# Patient Record
Sex: Male | Born: 1975 | Race: White | Hispanic: No | Marital: Single | Smoking: Never smoker
Health system: Southern US, Community
[De-identification: ages and names within clinical notes are randomized; demographics above are authoritative.]

## PROBLEM LIST (undated history)

## (undated) DIAGNOSIS — I10 Essential (primary) hypertension: Secondary | ICD-10-CM

## (undated) HISTORY — PX: BACK SURGERY: SHX140

---

## 2004-08-01 ENCOUNTER — Emergency Department: Payer: Self-pay | Admitting: Emergency Medicine

## 2004-08-19 ENCOUNTER — Emergency Department: Payer: Self-pay | Admitting: Emergency Medicine

## 2009-05-03 ENCOUNTER — Emergency Department: Payer: Self-pay | Admitting: Unknown Physician Specialty

## 2014-01-17 ENCOUNTER — Emergency Department: Payer: Self-pay | Admitting: Emergency Medicine

## 2015-10-05 ENCOUNTER — Ambulatory Visit: Payer: Self-pay

## 2015-10-05 DIAGNOSIS — J4540 Moderate persistent asthma, uncomplicated: Secondary | ICD-10-CM

## 2015-10-05 NOTE — Progress Notes (Signed)
This encounter was created in error - please disregard.

## 2016-12-02 ENCOUNTER — Emergency Department
Admission: EM | Admit: 2016-12-02 | Discharge: 2016-12-02 | Disposition: A | Discharge status: Home / Self Care | Payer: Self-pay | Attending: Emergency Medicine | Admitting: Emergency Medicine

## 2016-12-02 ENCOUNTER — Emergency Department: Payer: Self-pay

## 2016-12-02 ENCOUNTER — Encounter: Payer: Self-pay | Admitting: Emergency Medicine

## 2016-12-02 DIAGNOSIS — I10 Essential (primary) hypertension: Secondary | ICD-10-CM | POA: Insufficient documentation

## 2016-12-02 DIAGNOSIS — L03211 Cellulitis of face: Secondary | ICD-10-CM

## 2016-12-02 DIAGNOSIS — K122 Cellulitis and abscess of mouth: Secondary | ICD-10-CM | POA: Insufficient documentation

## 2016-12-02 HISTORY — DX: Essential (primary) hypertension: I10

## 2016-12-02 LAB — CBC WITH DIFFERENTIAL/PLATELET
BASOS ABS: 0.1 10*3/uL (ref 0–0.1)
Basophils Relative: 1 %
EOS PCT: 2 %
Eosinophils Absolute: 0.2 10*3/uL (ref 0–0.7)
HEMATOCRIT: 48.2 % (ref 40.0–52.0)
Hemoglobin: 16.5 g/dL (ref 13.0–18.0)
LYMPHS ABS: 0.9 10*3/uL — AB (ref 1.0–3.6)
LYMPHS PCT: 9 %
MCH: 33.6 pg (ref 26.0–34.0)
MCHC: 34.3 g/dL (ref 32.0–36.0)
MCV: 97.9 fL (ref 80.0–100.0)
MONO ABS: 1.3 10*3/uL — AB (ref 0.2–1.0)
Monocytes Relative: 13 %
NEUTROS ABS: 7.6 10*3/uL — AB (ref 1.4–6.5)
Neutrophils Relative %: 75 %
Platelets: 248 10*3/uL (ref 150–440)
RBC: 4.92 MIL/uL (ref 4.40–5.90)
RDW: 13.3 % (ref 11.5–14.5)
WBC: 10.1 10*3/uL (ref 3.8–10.6)

## 2016-12-02 LAB — BASIC METABOLIC PANEL
ANION GAP: 8 (ref 5–15)
BUN: 8 mg/dL (ref 6–20)
CHLORIDE: 104 mmol/L (ref 101–111)
CO2: 26 mmol/L (ref 22–32)
Calcium: 9 mg/dL (ref 8.9–10.3)
Creatinine, Ser: 0.79 mg/dL (ref 0.61–1.24)
GFR calc Af Amer: >60 mL/min (ref >60)
GFR calc non Af Amer: >60 mL/min (ref >60)
GLUCOSE: 104 mg/dL — AB (ref 65–99)
POTASSIUM: 3.9 mmol/L (ref 3.5–5.1)
Sodium: 138 mmol/L (ref 135–145)

## 2016-12-02 LAB — POCT RAPID STREP A: Streptococcus, Group A Screen (Direct): NEGATIVE

## 2016-12-02 MED ORDER — MORPHINE SULFATE (PF) 4 MG/ML IV SOLN
4.0000 mg | Freq: Once | INTRAVENOUS | Status: AC
Start: 2016-12-02 — End: 2016-12-02
  Administered 2016-12-02: 4 mg via INTRAVENOUS
  Filled 2016-12-02: qty 1

## 2016-12-02 MED ORDER — IOPAMIDOL (ISOVUE-300) INJECTION 61%
75.0000 mL | Freq: Once | INTRAVENOUS | Status: AC | PRN
  Administered 2016-12-02: 75 mL via INTRAVENOUS

## 2016-12-02 MED ORDER — AMOXICILLIN-POT CLAVULANATE 875-125 MG PO TABS
1.0000 | ORAL_TABLET | Freq: Once | ORAL | Status: AC
  Administered 2016-12-02: 1 via ORAL
  Filled 2016-12-02: qty 1

## 2016-12-02 MED ORDER — CLINDAMYCIN HCL 150 MG PO CAPS: 300.0000 mg | ORAL_CAPSULE | Freq: Once | ORAL | Status: DC

## 2016-12-02 MED ORDER — AMOXICILLIN-POT CLAVULANATE 875-125 MG PO TABS: 1.0000 | ORAL_TABLET | Freq: Two times a day (BID) | ORAL | 0 refills | Status: AC

## 2016-12-02 MED ORDER — CLINDAMYCIN HCL 150 MG PO CAPS: 450.0000 mg | ORAL_CAPSULE | Freq: Three times a day (TID) | ORAL | 0 refills | Status: AC

## 2016-12-02 MED ORDER — IBUPROFEN 800 MG PO TABS
800.0000 mg | ORAL_TABLET | Freq: Once | ORAL | Status: AC
  Administered 2016-12-02: 800 mg via ORAL
  Filled 2016-12-02: qty 1

## 2016-12-02 MED ORDER — ONDANSETRON HCL 4 MG/2ML IJ SOLN
4.0000 mg | Freq: Once | INTRAMUSCULAR | Status: AC
  Administered 2016-12-02: 4 mg via INTRAVENOUS
  Filled 2016-12-02: qty 2

## 2016-12-02 MED ORDER — SODIUM CHLORIDE 0.9 % IV BOLUS (SEPSIS)
1000.0000 mL | Freq: Once | INTRAVENOUS | Status: AC
  Administered 2016-12-02: 1000 mL via INTRAVENOUS

## 2016-12-02 NOTE — ED Provider Notes (Signed)
[facility] Emergency Department Provider Note ____________________________________________   I have reviewed the triage vital signs and the triage nursing note.  HISTORY  Chief Complaint Facial Swelling   Historian Patient  HPI Arthur Frazier is a 41 y.o. male presents with neck swelling. He states that he noticed swelling under his jaw yesterday morning and it got worse throughout the day and was difficult to sleep overnight due to trouble swallowing and worsening swelling and pain under his mandible. No redness. No fevers. No vomiting. Not really short of breath, but definite trouble swallowing. Denies sore throat, nasal congestion, headache.    Past Medical History:  Diagnosis Date  . Hypertension     There are no active problems to display for this patient.   Past Surgical History:  Procedure Laterality Date  . BACK SURGERY      Prior to Admission medications   Medication Sig Start Date End Date Taking? Authorizing Provider  amoxicillin-clavulanate (AUGMENTIN) 875-125 MG tablet Take 1 tablet by mouth 2 (two) times daily. 12/02/16 12/12/16  Governor Rooks, MD  clindamycin (CLEOCIN) 150 MG capsule Take 3 capsules (450 mg total) by mouth 3 (three) times daily. 12/02/16 12/12/16  Governor Rooks, MD    Allergies  Allergen Reactions  . Demerol [Meperidine] Nausea And Vomiting    No family history on file.  Social History Social History  Substance Use Topics  . Smoking status: Never Smoker  . Smokeless tobacco: Never Used  . Alcohol use Yes     Comment: occasionally    Review of Systems  Constitutional: Negative for fever. Eyes: Negative for visual changes. ENT: Negative for sore throat.  Swelling of the jaw as per history of present illness Cardiovascular: Negative for chest pain. Respiratory: Negative for shortness of breath. Gastrointestinal: Negative for abdominal pain, vomiting and diarrhea. Genitourinary: Negative for  dysuria. Musculoskeletal: Negative for back pain. Skin: Negative for rash. Neurological: Negative for headache. 10 point Review of Systems otherwise negative ____________________________________________   PHYSICAL EXAM:  VITAL SIGNS: ED Triage Vitals  Enc Vitals Group     BP 12/02/16 0655 (!) 158/103     Pulse Rate 12/02/16 0655 70     Resp 12/02/16 0655 20     Temp 12/02/16 0655 97.7 F (36.5 C)     Temp src --      SpO2 12/02/16 0655 95 %     Weight 12/02/16 0651 280 lb (127 kg)     Height 12/02/16 0651  (1.778 m)     Head Circumference --      Peak Flow --      Pain Score 12/02/16 0651 7     Pain Loc --      Pain Edu? --      Excl. in GC? --      Constitutional: Alert and oriented. Well appearing and in no distress. HEENT   Head: Normocephalic and atraumatic.      Eyes: Conjunctivae are normal. PERRL. Normal extraocular movements.      Ears:         Nose: No congestion/rhinnorhea.   Mouth/Throat: Mucous membranes are moist.   Neck: No stridor.  Large amount of swelling under his mandible, firm, and tender to palpation. Very mild redness without an obvious skin cellulitis. No swelling under the tongue. Posterior oropharynx mildly erythematous without swollen tonsils or tonsillar exudates. Normal uvula. Cardiovascular/Chest: Normal rate, regular rhythm.  No murmurs, rubs, or gallops. Respiratory: Normal respiratory effort without tachypnea nor retractions.  Breath sounds are clear and equal bilaterally. No wheezes/rales/rhonchi. Gastrointestinal: Soft. No distention, no guarding, no rebound. Nontender.  Obese  Genitourinary/rectal:Deferred Musculoskeletal: Nontender with normal range of motion in all extremities. No joint effusions.  No lower extremity tenderness.  No edema. Neurologic:  Normal speech and language. No gross or focal neurologic deficits are appreciated. Skin:  Skin is warm, dry and intact. No rash noted. Psychiatric: Mood and affect are  normal. Speech and behavior are normal. Patient exhibits appropriate insight and judgment.   ____________________________________________  LABS (pertinent positives/negatives)  Labs Reviewed  BASIC METABOLIC PANEL - Abnormal; Notable for the following:       Result Value   Glucose, Bld 104 (*)    All other components within normal limits  CBC WITH DIFFERENTIAL/PLATELET - Abnormal; Notable for the following:    Neutro Abs 7.6 (*)    Lymphs Abs 0.9 (*)    Monocytes Absolute 1.3 (*)    All other components within normal limits  POCT RAPID STREP A    ____________________________________________    EKG I, Governor Rooks, MD, the attending physician have personally viewed and interpreted all ECGs.  None ____________________________________________  RADIOLOGY All Xrays were viewed by me. Imaging interpreted by Radiologist.  CT soft tissue neck with contrast:  IMPRESSION: Very mild nonspecific submental swelling. No fluid collection or other acute abnormality identified. __________________________________________  PROCEDURES  Procedure(s) performed: None  Critical Care performed: None  ____________________________________________   ED COURSE / ASSESSMENT AND PLAN  Pertinent labs & imaging results that were available during my care of the patient were reviewed by me and considered in my medical decision making (see chart for details).   No airway compromise. He does have a fair amount of swelling under the mandible.  Does not appear to be an obvious abscess or cellulitis.   However given the amount of pain in the amount of swelling, I am going to recommend laboratory studies and CT neck further evaluation.  CT reassuring for no abscess, and it does show inflammation of the submental space. I will treat for infection there. Again looking at his skin there is no evidence of obvious cellulitis, and so I am going to recommend treating with a pneumatic to cover for likely oral  source. I recommended Augmentin. He was given a dose in the ER. He does not have insurance coverage, and so I did actually provide him 2 prescriptions, one for Augmentin and one for clindamycin depending on which one is cheaper is the one that he will fill.   CONSULTATIONS:   None Patient / Family / Caregiver informed of clinical course, medical decision-making process, and agree with plan.   I discussed return precautions, follow-up instructions, and discharge instructions with patient and/or family.  Discharge instructions:You were evaluated for swelling under the chin and are being treated for infection of the submental space. As we discussed, my first choice antibiotic for use Augmentin, however if this is too expensive, clindamycin would be the second choice.  Please only fill 1, and complete the entire course of antibiotics.  Return to the emergency department immediately for any worsening swelling, trouble swallowing, trouble breathing, skin rash, fever, nausea or vomiting, or any other symptoms concerning to you. ___________________________________________   FINAL CLINICAL IMPRESSION(S) / ED DIAGNOSES   Final diagnoses:  Cellulitis of submental space              Note: This dictation was prepared with Dragon dictation. Any transcriptional errors that result from this process  are unintentional    Governor Rooks, MD 12/02/16 1002

## 2016-12-02 NOTE — Discharge Instructions (Signed)
You were evaluated for swelling under the chin and are being treated for infection of the submental space. As we discussed, my first choice antibiotic for use Augmentin, however if this is too expensive, clindamycin would be the second choice.  Please only fill 1, and complete the entire course of antibiotics.  Return to the emergency department immediately for any worsening swelling, trouble swallowing, trouble breathing, skin rash, fever, nausea or vomiting, or any other symptoms concerning to you.

## 2016-12-02 NOTE — ED Notes (Signed)
Patient transported to CT 

## 2016-12-02 NOTE — ED Triage Notes (Signed)
Pt presents ambulatory to triage with c/o throat swelling. Pt has swollen area that started Sunday morning and pt reports that it has become worse. Pt states that it is hard to swallow. Pt is ambulatory to triage and able to talk in complete sentences. Pt is in NAD at this time.

## 2017-10-30 IMAGING — CT CT NECK W/ CM
4 of 5 series · 14 of 33 positions shown, 16 images · IV contrast (iopamidol)
Comparison: None.

CLINICAL DATA: Submandibular and neck swelling.

EXAM:
CT NECK WITH CONTRAST
TECHNIQUE: Multidetector CT imaging of the neck was performed using the
standard protocol following the bolus administration of intravenous
contrast.
CONTRAST:  75mL CC7CUA-911 IOPAMIDOL ([id number]-911) INJECTION 61%

[Series 2: axial neck · axial · 0.67mm/px · z∈[-277,-129]mm · 4 of 124 slices shown, 5 images]
[im 25/124  soft-tissue]
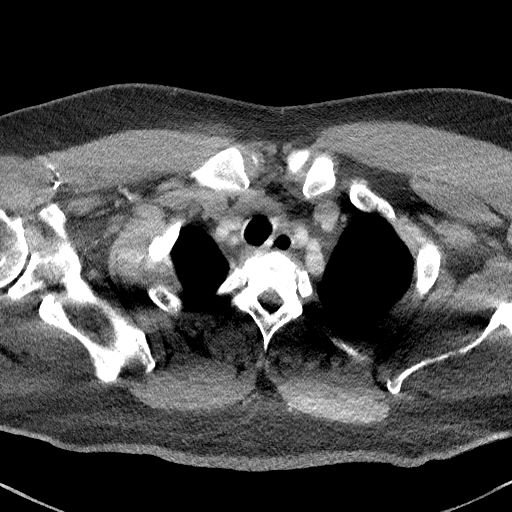
[im 25/124  bone]
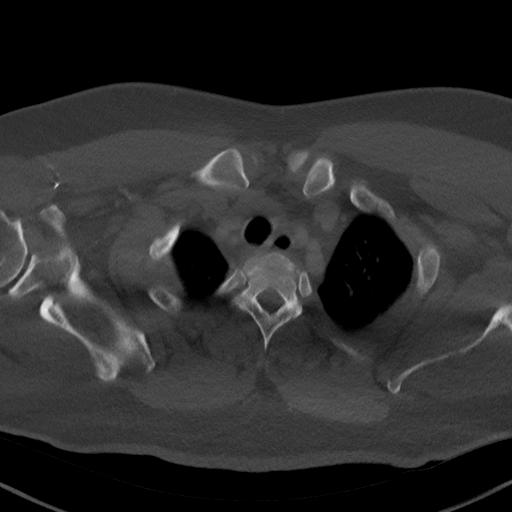
[im 50/124  bone]
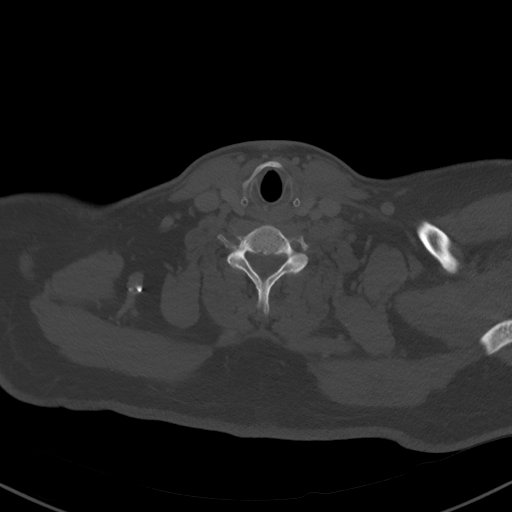
[im 74/124  bone]
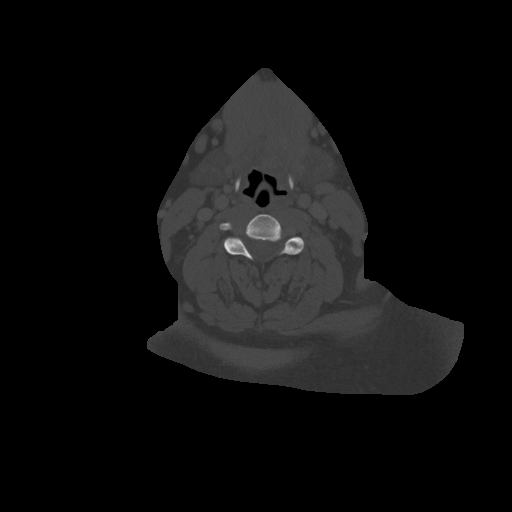
[im 99/124  bone]
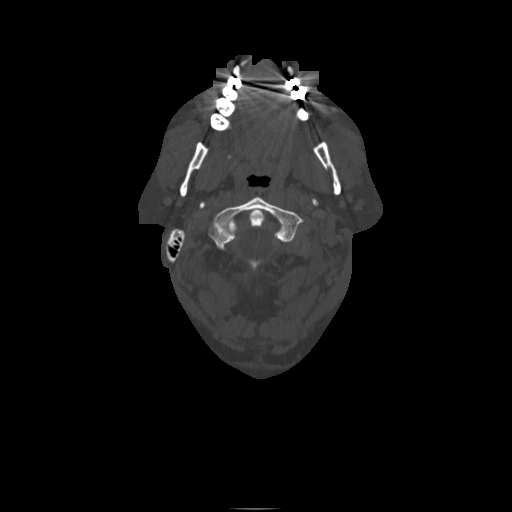

[Series 6: sag neck · sagittal · 0.56mm/px · 5 of 97 slices shown, 6 images]
[im 33/97  bone]
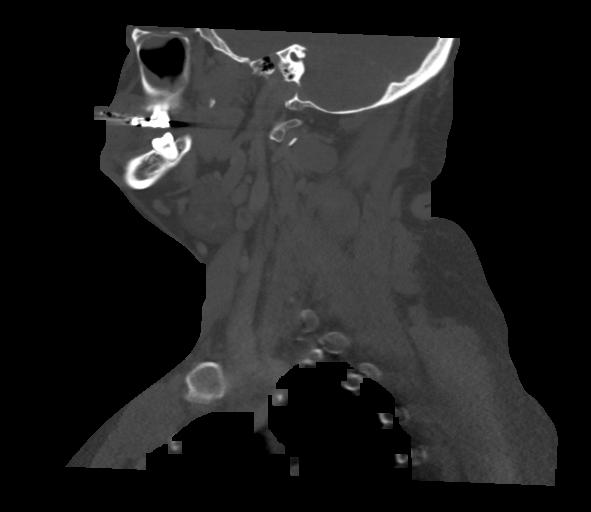
[im 41/97  bone]
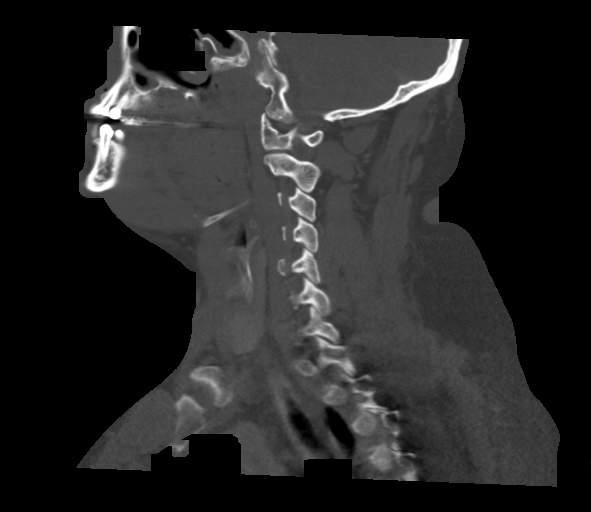
[im 49/97  soft-tissue]
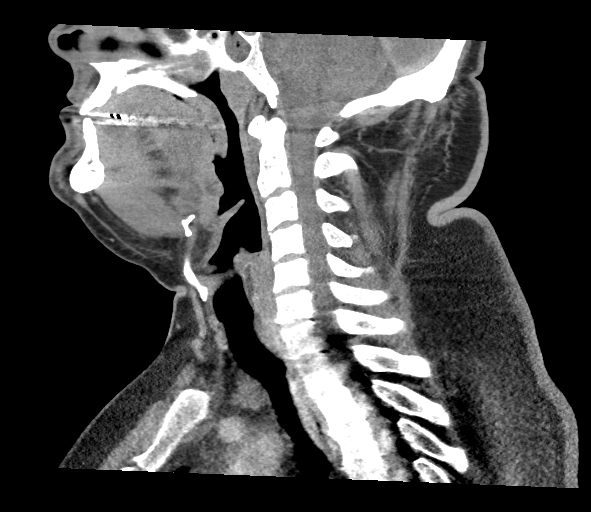
[im 49/97  bone]
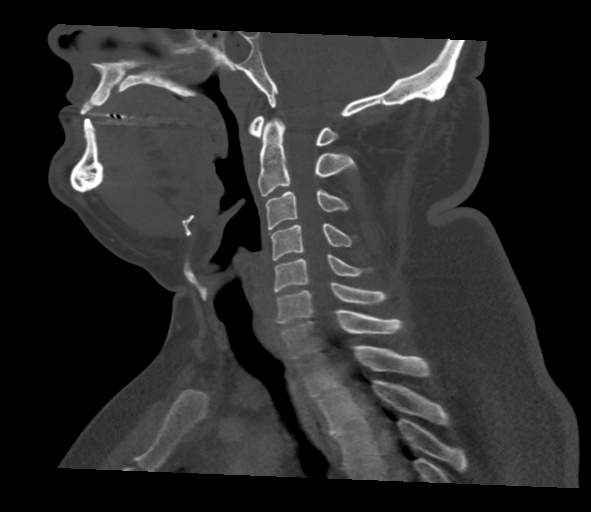
[im 57/97  bone]
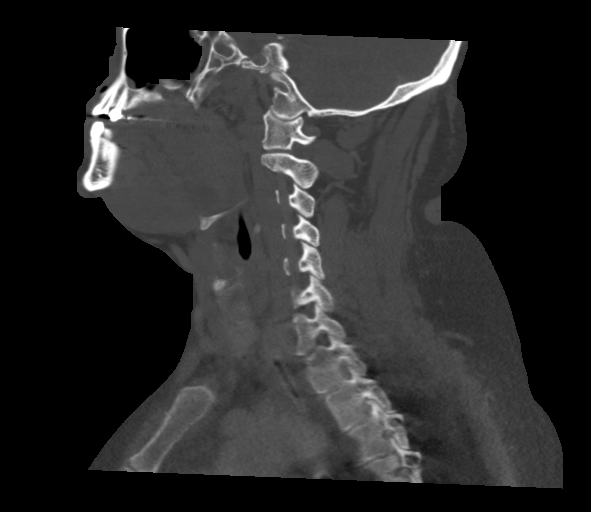
[im 65/97  bone]
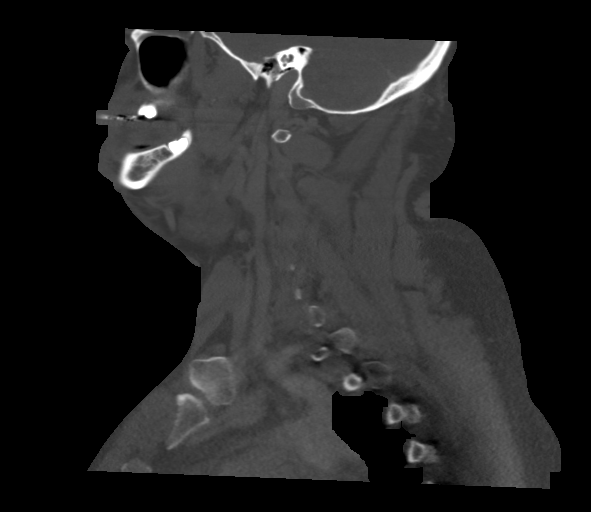

[Series 7: cor neck · coronal · 0.43mm/px · 3 of 136 slices shown]
[im 28/136  bone]
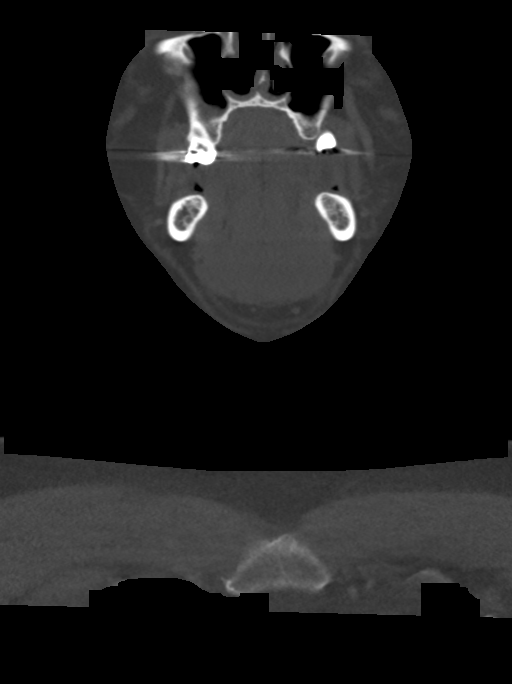
[im 55/136  bone]
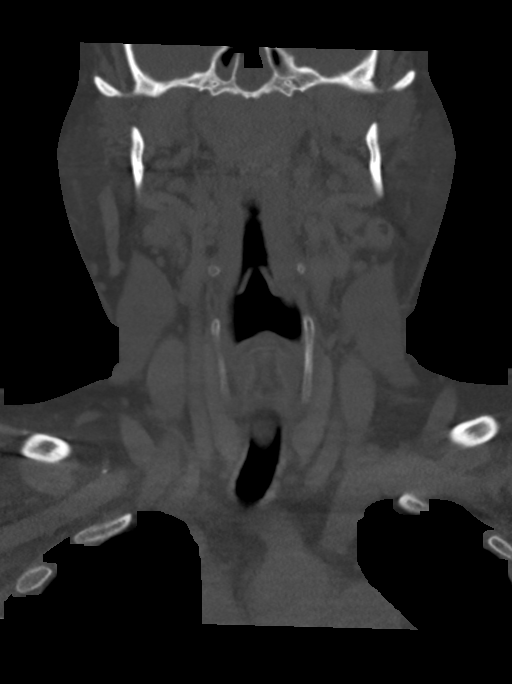
[im 82/136  bone]
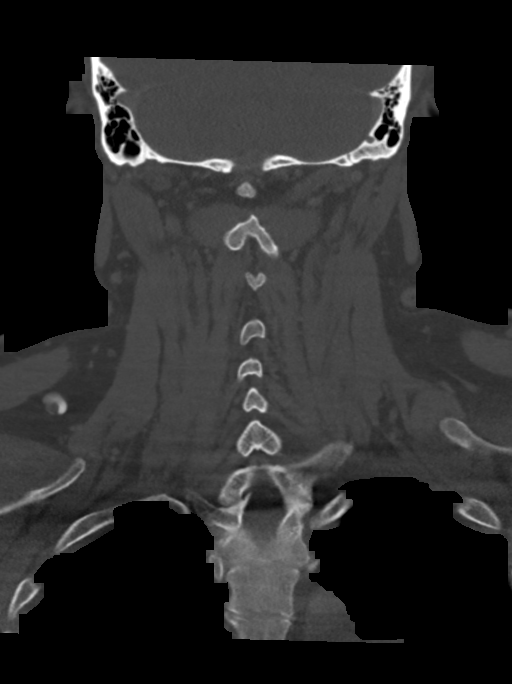

[Series 8: orthogonal ax · axial · 0.42mm/px · z∈[-298,-247]mm · 2 of 129 slices shown]
[im 26/129  bone]
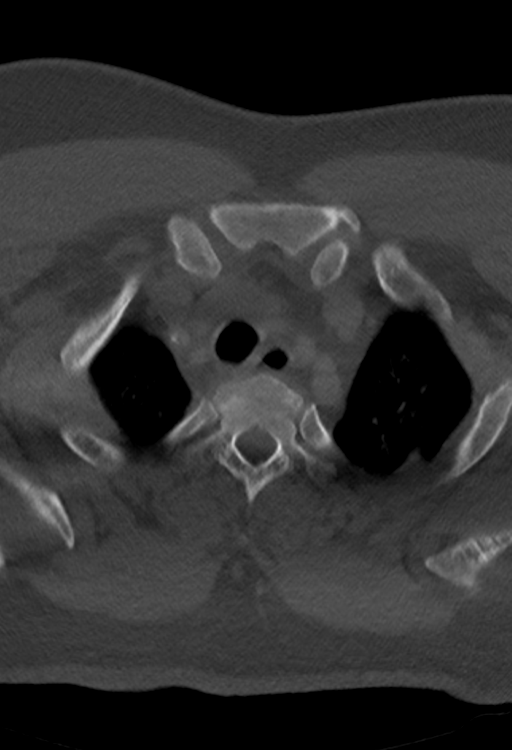
[im 52/129  bone]
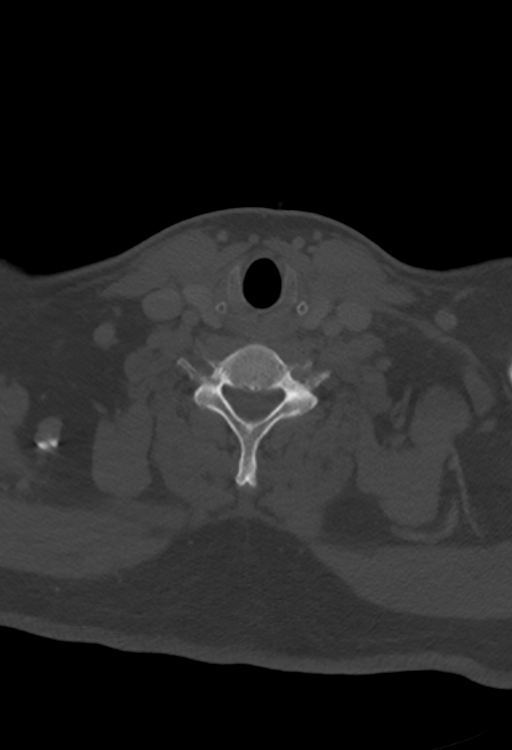

[14 of 33 positions shown; findings below may reference images not displayed]

FINDINGS: Pharynx and larynx: No evidence of pharyngeal mass or
parapharyngeal/ retropharyngeal inflammatory change. Unremarkable
larynx.

Salivary glands: There is very mild inflammatory stranding in the
submental space. No fluid collection. The submandibular and parotid
glands are unremarkable. No salivary calculi are identified.

Thyroid: Unremarkable.

Lymph nodes: Slightly increased number of small/ benign-appearing
lymph nodes in the neck bilaterally.

Vascular: Unremarkable.

Limited intracranial: Unremarkable.

Visualized orbits: Not imaged.

Mastoids and visualized paranasal sinuses: Mild bilateral maxillary
and moderate bilateral sphenoid sinus mucosal thickening. Clear
mastoid air cells.

Skeleton: No acute osseous abnormality or suspicious osseous lesion.

Upper chest: Unremarkable.

Other: None.
IMPRESSION: Very mild nonspecific submental swelling. No fluid collection or
other acute abnormality identified.
